# Patient Record
Sex: Male | Born: 1955 | Race: White | Hispanic: No | Marital: Married | Smoking: Never smoker
Health system: Southern US, Community
[De-identification: ages and names within clinical notes are randomized; demographics above are authoritative.]

## PROBLEM LIST (undated history)

## (undated) DIAGNOSIS — K5792 Diverticulitis of intestine, part unspecified, without perforation or abscess without bleeding: Secondary | ICD-10-CM

## (undated) DIAGNOSIS — J45909 Unspecified asthma, uncomplicated: Secondary | ICD-10-CM

## (undated) HISTORY — PX: TONSILLECTOMY: SUR1361

---

## 2016-08-18 ENCOUNTER — Emergency Department: Payer: Managed Care, Other (non HMO)

## 2016-08-18 ENCOUNTER — Encounter: Payer: Self-pay | Admitting: Emergency Medicine

## 2016-08-18 ENCOUNTER — Emergency Department
Admission: EM | Admit: 2016-08-18 | Discharge: 2016-08-18 | Disposition: A | Discharge status: Home / Self Care | Payer: Managed Care, Other (non HMO) | Attending: Emergency Medicine | Admitting: Emergency Medicine

## 2016-08-18 DIAGNOSIS — J45909 Unspecified asthma, uncomplicated: Secondary | ICD-10-CM | POA: Diagnosis not present

## 2016-08-18 DIAGNOSIS — M542 Cervicalgia: Secondary | ICD-10-CM | POA: Diagnosis not present

## 2016-08-18 HISTORY — DX: Unspecified asthma, uncomplicated: J45.909

## 2016-08-18 HISTORY — DX: Diverticulitis of intestine, part unspecified, without perforation or abscess without bleeding: K57.92

## 2016-08-18 LAB — CBC
HCT: 40.2 % (ref 40.0–52.0)
HEMOGLOBIN: 13.7 g/dL (ref 13.0–18.0)
MCH: 30.4 pg (ref 26.0–34.0)
MCHC: 34.2 g/dL (ref 32.0–36.0)
MCV: 88.8 fL (ref 80.0–100.0)
Platelets: 222 10*3/uL (ref 150–440)
RBC: 4.53 MIL/uL (ref 4.40–5.90)
RDW: 13.5 % (ref 11.5–14.5)
WBC: 8.1 10*3/uL (ref 3.8–10.6)

## 2016-08-18 LAB — BASIC METABOLIC PANEL
Anion gap: 9 (ref 5–15)
BUN: 17 mg/dL (ref 6–20)
CHLORIDE: 104 mmol/L (ref 101–111)
CO2: 27 mmol/L (ref 22–32)
Calcium: 8.9 mg/dL (ref 8.9–10.3)
Creatinine, Ser: 0.99 mg/dL (ref 0.61–1.24)
GFR calc non Af Amer: >60 mL/min (ref >60)
Glucose, Bld: 119 mg/dL — ABNORMAL HIGH (ref 65–99)
Potassium: 4.1 mmol/L (ref 3.5–5.1)
SODIUM: 140 mmol/L (ref 135–145)

## 2016-08-18 LAB — TROPONIN I

## 2016-08-18 MED ORDER — PREDNISONE 20 MG PO TABS: 40.0000 mg | ORAL_TABLET | Freq: Every day | ORAL | 0 refills | Status: AC

## 2016-08-18 MED ORDER — IOPAMIDOL (ISOVUE-370) INJECTION 76%
75.0000 mL | Freq: Once | INTRAVENOUS | Status: AC | PRN
  Administered 2016-08-18: 75 mL via INTRAVENOUS

## 2016-08-18 MED ORDER — TRAMADOL HCL 50 MG PO TABS: 50.0000 mg | ORAL_TABLET | Freq: Four times a day (QID) | ORAL | 0 refills | Status: AC | PRN

## 2016-08-18 MED ORDER — ACETAMINOPHEN 500 MG PO TABS
1000.0000 mg | ORAL_TABLET | Freq: Once | ORAL | Status: AC
  Administered 2016-08-18: 1000 mg via ORAL
  Filled 2016-08-18: qty 2

## 2016-08-18 MED ORDER — PREDNISONE 20 MG PO TABS
60.0000 mg | ORAL_TABLET | Freq: Once | ORAL | Status: AC
  Administered 2016-08-18: 60 mg via ORAL
  Filled 2016-08-18: qty 3

## 2016-08-18 MED ORDER — TRAMADOL HCL 50 MG PO TABS
100.0000 mg | ORAL_TABLET | Freq: Once | ORAL | Status: AC
  Administered 2016-08-18: 100 mg via ORAL
  Filled 2016-08-18: qty 2

## 2016-08-18 NOTE — ED Notes (Signed)
Patient transported to CT 

## 2016-08-18 NOTE — ED Provider Notes (Signed)
[facility] Emergency Department Provider Note  ____________________________________________  Time seen: Approximately 2:48 PM  I have reviewed the triage vital signs and the nursing notes.   HISTORY  Chief Complaint Neck Pain   HPI Bradley Martin is a 61 y.o. male with a history of asthma who presents for evaluation of neck pain. Patient reports that he was walking around at work when he developed sudden onset of severe bilateral neck pain that he describes as a sharp pain. He felt dizzy and felt like he was going to pass out. The pain was so severe that patient reports it brought him down to his knees. It lasted 15 min. 20 minutes later he had a second episode which was witnessed by a coworker who told him that he looked pale and diaphoretic which prompted EMS to be called. Patient denies ever having neck problems in the past. He reports that earlier today he was carrying heavy boxes at work but the pain did not start at that time. He denies headache, blurry vision, facial droop, slurred speech, difficulty finding words, unilateral weakness or numbness or paresthesias of his extremities. No back pain, no chest pain, no shortness of breath, no dizziness or lightheadedness. Patient currently endorses a 7 out of 10 pain located in the bilateral sides of the neck worse with flexion and extension of the neck. He denies personal or family history of ischemic heart disease, he is not a smoker.  Past Medical History:  Diagnosis Date  . Asthma   . Diverticulitis     There are no active problems to display for this patient.   Past Surgical History:  Procedure Laterality Date  . TONSILLECTOMY      Prior to Admission medications   Medication Sig Start Date End Date Taking? Authorizing Provider  predniSONE (DELTASONE) 20 MG tablet Take 2 tablets (40 mg total) by mouth daily. 08/18/16   Minna Antis, MD  traMADol (ULTRAM) 50 MG tablet Take 1 tablet (50 mg total)  by mouth every 6 (six) hours as needed. 08/18/16 08/18/17  Minna Antis, MD    Allergies Amoxicillin; Azithromycin; and Codeine  No family history on file.  Social History Social History  Substance Use Topics  . Smoking status: Never Smoker  . Smokeless tobacco: Never Used  . Alcohol use Yes    Review of Systems  Constitutional: Negative for fever. Eyes: Negative for visual changes. ENT: Negative for sore throat. Neck: + neck pain  Cardiovascular: Negative for chest pain. Respiratory: Negative for shortness of breath. Gastrointestinal: Negative for abdominal pain, vomiting or diarrhea. Genitourinary: Negative for dysuria. Musculoskeletal: Negative for back pain. Skin: Negative for rash. Neurological: Negative for headaches, weakness or numbness. Psych: No SI or HI  ____________________________________________   PHYSICAL EXAM:  VITAL SIGNS: ED Triage Vitals  Enc Vitals Group     BP 08/18/16 1437 (!) 166/92     Pulse Rate 08/18/16 1437 74     Resp 08/18/16 1437 16     Temp 08/18/16 1437 97.7 F (36.5 C)     Temp Source 08/18/16 1437 Oral     SpO2 08/18/16 1437 99 %     Weight 08/18/16 1429 207 lb (93.9 kg)     Height 08/18/16 1429  (1.803 m)     Head Circumference --      Peak Flow --      Pain Score 08/18/16 1425 6     Pain Loc --      Pain Edu? --  Excl. in GC? --     Constitutional: Alert and oriented. Well appearing and in no apparent distress. HEENT:      Head: Normocephalic and atraumatic.         Eyes: Conjunctivae are normal. Sclera is non-icteric. EOMI. PERRL      Mouth/Throat: Mucous membranes are moist.       Neck: Supple with no signs of meningismus. No midline C-spine tenderness. No tenderness of the paraspinal region, no masses palpable, no carotid bruits Cardiovascular: Regular rate and rhythm. No murmurs, gallops, or rubs. 2+ symmetrical distal pulses are present in all extremities. No JVD. Respiratory: Normal respiratory  effort. Lungs are clear to auscultation bilaterally. No wheezes, crackles, or rhonchi.  Gastrointestinal: Soft, non tender, and non distended with positive bowel sounds. No rebound or guarding. Genitourinary: No CVA tenderness. Musculoskeletal: Nontender with normal range of motion in all extremities. No edema, cyanosis, or erythema of extremities. Neurologic: Normal speech and language. Face is symmetric. Moving all extremities. No gross focal neurologic deficits are appreciated. Skin: Skin is warm, dry and intact. No rash noted. Psychiatric: Mood and affect are normal. Speech and behavior are normal.  ____________________________________________   LABS (all labs ordered are listed, but only abnormal results are displayed)  Labs Reviewed  BASIC METABOLIC PANEL - Abnormal; Notable for the following:       Result Value   Glucose, Bld 119 (*)    All other components within normal limits  CBC  TROPONIN I  TROPONIN I   ____________________________________________  EKG  ED ECG REPORT I, Nita Sickle, the attending physician, personally viewed and interpreted this ECG.  Normal sinus rhythm, rate of 74, right bundle branch block, normal QTc interval, normal axis, no ST elevations or depressions. No prior for comparison ____________________________________________  RADIOLOGY  CTA neck: PND ____________________________________________   PROCEDURES  Procedure(s) performed: None Procedures Critical Care performed:  None ____________________________________________   INITIAL IMPRESSION / ASSESSMENT AND PLAN / ED COURSE  61 y.o. male with a history of asthma who presents for evaluation of sudden onset of bilateral neck pain.Patient is well-appearing, in no distress, is neurologically intact, has no neck tenderness on exam, no carotid bruits. EKG with no ischemic changes. CT of the neck has been ordered to rule out dissection. We'll cycle cardiac enzymes although history sounds  atypical for ACS.    _________________________ 3:35 PM on 08/18/2016 ----------------------------------------- Care transferred to Dr. Lenard Lance   Pertinent labs & imaging results that were available during my care of the patient were reviewed by me and considered in my medical decision making (see chart for details).    ____________________________________________   FINAL CLINICAL IMPRESSION(S) / ED DIAGNOSES  Final diagnoses:  Neck pain      NEW MEDICATIONS STARTED DURING THIS VISIT:  Discharge Medication List as of 08/18/2016  5:38 PM    START taking these medications   Details  predniSONE (DELTASONE) 20 MG tablet Take 2 tablets (40 mg total) by mouth daily., Starting Tue 08/18/2016, Print    traMADol (ULTRAM) 50 MG tablet Take 1 tablet (50 mg total) by mouth every 6 (six) hours as needed., Starting Tue 08/18/2016, Until Wed 08/18/2017, Print         Note:  This document was prepared using Dragon voice recognition software and may include unintentional dictation errors.    Nita Sickle, MD 08/19/16 2348

## 2016-08-18 NOTE — ED Triage Notes (Signed)
Patient from work via [employer]. Reports sudden onset neck pain beginning this morning. Patient reports feeling similar to whiplash with intermittent spasms on both sides of neck . Patient states pain worse with movement. Denies injury. Reports severe asthma attack over the weekend but no other recent illness. Patient alert and oriented x4.

## 2016-08-18 NOTE — ED Provider Notes (Signed)
-----------------------------------------   5:17 PM on 08/18/2016 -----------------------------------------  Patient care is him from Dr. Don Perking. Patient's labs are normal including negative troponin. Patient continues to take sharp pain in the neck radiating down to the left arm which she describes as a shooting type pain. It feels like a pinched nerve. Patient's symptoms appear to be most consistent with neuropathic pain. We will start on prednisone, we will dose Ultram for pain relief. We will repeat a troponin. Repeat troponin is negative patient will be discharged with a prednisone prescription as well as Ultram.  Repeat troponin is negative. CT angiography is negative. Highly suspect neuropathic pain. We'll discharge her prednisone and Ultram. Patient agreeable to plan.   Minna Antis, MD 08/18/16 [phone]

## 2016-08-18 NOTE — ED Notes (Signed)
AAOx3.  Skin warm and dry.  Ambulates with with easy and steady gait.  Upright posture.  NAD

## 2018-11-25 IMAGING — CT CT ANGIO NECK
1 of 7 series · 7 of 33 positions shown · IV contrast (APPLIED)
Comparison: None.

CLINICAL DATA: Sudden onset of neck pain beginning this morning.

EXAM:
CT ANGIOGRAPHY NECK
TECHNIQUE: Multidetector CT imaging of the neck was performed using the
standard protocol during bolus administration of intravenous
contrast. Multiplanar CT image reconstructions and MIPs were
obtained to evaluate the vascular anatomy. Carotid stenosis
measurements (when applicable) are obtained utilizing NASCET
criteria, using the distal internal carotid diameter as the
denominator.
CONTRAST:  75 cc Isovue 370

[Series 6: ax thin · axial · 0.47mm/px · z∈[-328,-121]mm · 7 of 279 slices shown]
[im 35/279  soft-tissue]
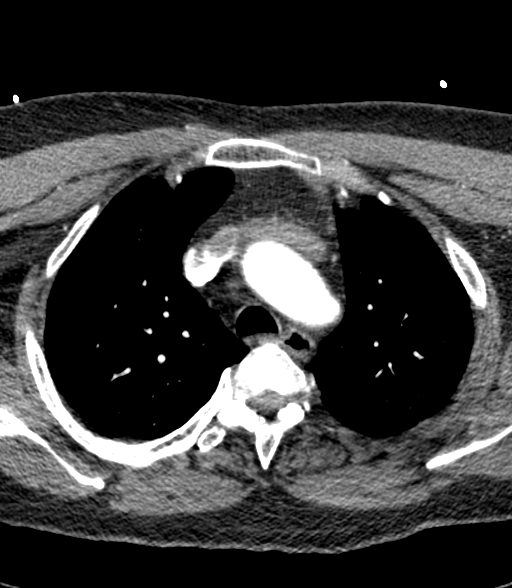
[im 70/279  bone]
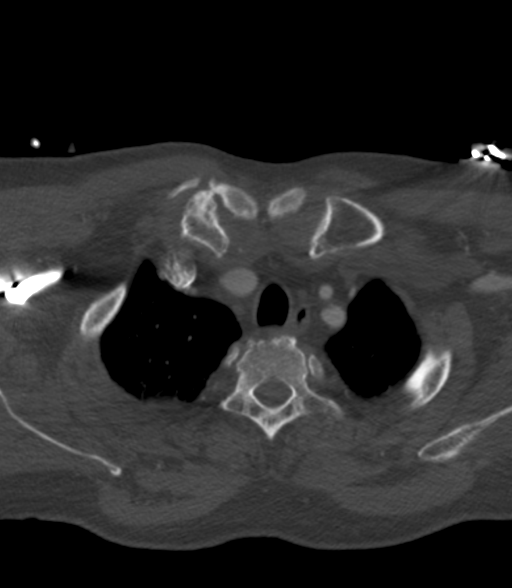
[im 105/279  soft-tissue]
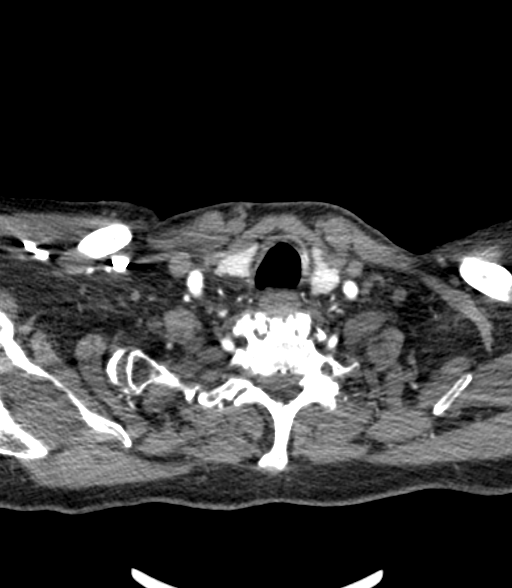
[im 140/279  bone]
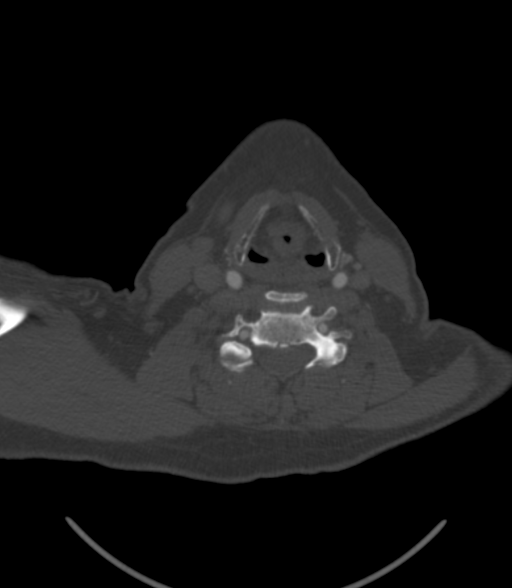
[im 174/279  soft-tissue]
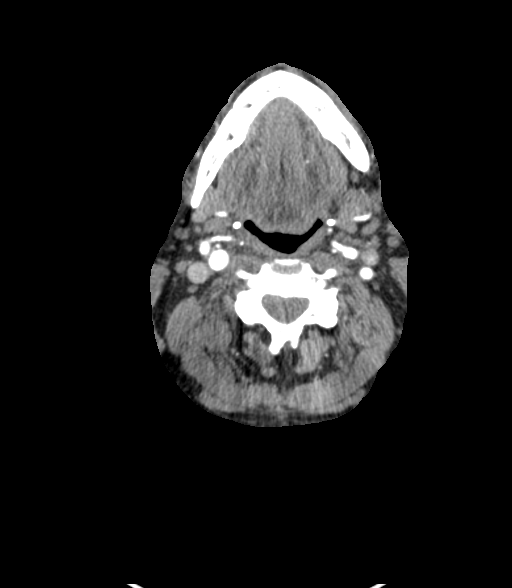
[im 209/279  bone]
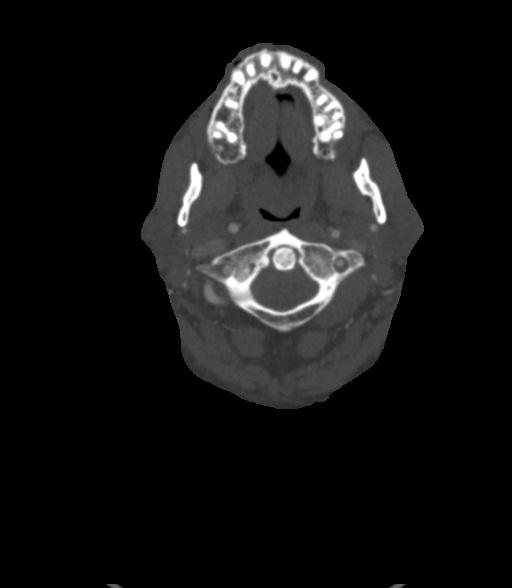
[im 244/279  soft-tissue]
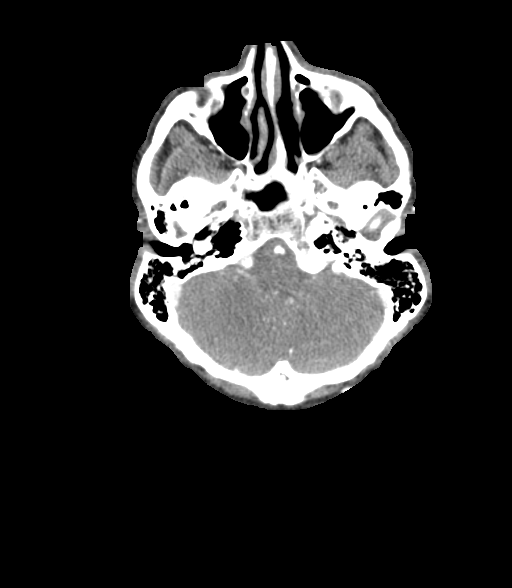

[7 of 33 positions shown; findings below may reference images not displayed]

FINDINGS: Aortic arch: Normal.  Branching pattern normal.

Right carotid system: Common carotid artery is tortuous but widely
patent to the bifurcation. Carotid bifurcation is normal without
evidence of atherosclerotic change. Cervical internal carotid artery
is normal, patent to the siphon. There is some siphon
atherosclerotic calcification.

Left carotid system: Common carotid artery is tortuous but widely
patent to the bifurcation. Mild atherosclerotic disease at the
carotid bifurcation but without stenosis. Cervical ICA is tortuous
but widely patent. There is some atherosclerotic calcification in
the siphon region.

Vertebral arteries: Both vertebral artery origins are widely patent.
No stenosis in the cervical region. Both vessels contribute to the
basilar. Proximal basilar appears normal.

Skeleton: Advanced degenerative spondylosis with straightening and
kyphotic curvature. 4 mm anterolisthesis C4-5 because of facet
arthropathy on the right. Degenerative spondylosis with canal and
foraminal stenoses.

Other neck: Negative

Upper chest: Normal
IMPRESSION: No acute vascular finding. No evidence of dissection, stenosis or
occlusion. Minimal atherosclerotic change at the left carotid
bifurcation.

Vessels are somewhat tortuous as might be seen with hypertension.

Advanced degenerative disease of the cervical spine. Straightening
and kyphotic curvature. Degenerative anterolisthesis at C4-5 of 4 mm
because of advanced right facet arthropathy. Canal and foraminal
encroachment by osteophytes at multiple levels.
